# Patient Record
Sex: Female | Born: 1950 | Race: White | Hispanic: No | Marital: Married | Smoking: Never smoker
Health system: Southern US, Community
[De-identification: ages and names within clinical notes are randomized; demographics above are authoritative.]

## PROBLEM LIST (undated history)

## (undated) DIAGNOSIS — C801 Malignant (primary) neoplasm, unspecified: Secondary | ICD-10-CM

---

## 2015-01-16 ENCOUNTER — Encounter (HOSPITAL_COMMUNITY): Payer: Self-pay | Admitting: Emergency Medicine

## 2015-01-16 ENCOUNTER — Emergency Department (HOSPITAL_COMMUNITY): Payer: No Typology Code available for payment source

## 2015-01-16 ENCOUNTER — Emergency Department (HOSPITAL_COMMUNITY)
Admission: EM | Admit: 2015-01-16 | Discharge: 2015-01-16 | Disposition: A | Discharge status: Home / Self Care | Payer: No Typology Code available for payment source | Attending: Emergency Medicine | Admitting: Emergency Medicine

## 2015-01-16 DIAGNOSIS — Z23 Encounter for immunization: Secondary | ICD-10-CM | POA: Insufficient documentation

## 2015-01-16 DIAGNOSIS — S8011XA Contusion of right lower leg, initial encounter: Secondary | ICD-10-CM | POA: Diagnosis not present

## 2015-01-16 DIAGNOSIS — Y9389 Activity, other specified: Secondary | ICD-10-CM | POA: Insufficient documentation

## 2015-01-16 DIAGNOSIS — Z859 Personal history of malignant neoplasm, unspecified: Secondary | ICD-10-CM | POA: Insufficient documentation

## 2015-01-16 DIAGNOSIS — Y92410 Unspecified street and highway as the place of occurrence of the external cause: Secondary | ICD-10-CM | POA: Diagnosis not present

## 2015-01-16 DIAGNOSIS — Y998 Other external cause status: Secondary | ICD-10-CM | POA: Diagnosis not present

## 2015-01-16 DIAGNOSIS — S62291A Other fracture of first metacarpal bone, right hand, initial encounter for closed fracture: Secondary | ICD-10-CM | POA: Insufficient documentation

## 2015-01-16 DIAGNOSIS — S79922A Unspecified injury of left thigh, initial encounter: Secondary | ICD-10-CM | POA: Insufficient documentation

## 2015-01-16 DIAGNOSIS — S62201B Unspecified fracture of first metacarpal bone, right hand, initial encounter for open fracture: Secondary | ICD-10-CM

## 2015-01-16 DIAGNOSIS — S4991XA Unspecified injury of right shoulder and upper arm, initial encounter: Secondary | ICD-10-CM | POA: Diagnosis present

## 2015-01-16 HISTORY — DX: Malignant (primary) neoplasm, unspecified: C80.1

## 2015-01-16 LAB — URINALYSIS, ROUTINE W REFLEX MICROSCOPIC
Bilirubin Urine: NEGATIVE
Glucose, UA: NEGATIVE mg/dL
Hgb urine dipstick: NEGATIVE
Ketones, ur: NEGATIVE mg/dL
LEUKOCYTES UA: NEGATIVE
NITRITE: NEGATIVE
Protein, ur: NEGATIVE mg/dL
SPECIFIC GRAVITY, URINE: 1.014 (ref 1.005–1.030)
UROBILINOGEN UA: 0.2 mg/dL (ref 0.0–1.0)
pH: 7.5 (ref 5.0–8.0)

## 2015-01-16 LAB — BASIC METABOLIC PANEL
Anion gap: 8 (ref 5–15)
BUN: 18 mg/dL (ref 6–20)
CO2: 29 mmol/L (ref 22–32)
CREATININE: 0.93 mg/dL (ref 0.44–1.00)
Calcium: 10 mg/dL (ref 8.9–10.3)
Chloride: 104 mmol/L (ref 101–111)
GFR calc non Af Amer: >60 mL/min (ref >60)
Glucose, Bld: 103 mg/dL — ABNORMAL HIGH (ref 65–99)
Potassium: 4.1 mmol/L (ref 3.5–5.1)
Sodium: 141 mmol/L (ref 135–145)

## 2015-01-16 LAB — CBC WITH DIFFERENTIAL/PLATELET
BASOS PCT: 0 %
Basophils Absolute: 0 10*3/uL (ref 0.0–0.1)
EOS PCT: 1 %
Eosinophils Absolute: 0.1 10*3/uL (ref 0.0–0.7)
HEMATOCRIT: 39.2 % (ref 36.0–46.0)
Hemoglobin: 12.9 g/dL (ref 12.0–15.0)
Lymphocytes Relative: 22 %
Lymphs Abs: 2 10*3/uL (ref 0.7–4.0)
MCH: 27.9 pg (ref 26.0–34.0)
MCHC: 32.9 g/dL (ref 30.0–36.0)
MCV: 84.7 fL (ref 78.0–100.0)
MONO ABS: 0.5 10*3/uL (ref 0.1–1.0)
MONOS PCT: 5 %
NEUTROS ABS: 6.8 10*3/uL (ref 1.7–7.7)
Neutrophils Relative %: 72 %
PLATELETS: 225 10*3/uL (ref 150–400)
RBC: 4.63 MIL/uL (ref 3.87–5.11)
RDW: 13.3 % (ref 11.5–15.5)
WBC: 9.4 10*3/uL (ref 4.0–10.5)

## 2015-01-16 LAB — URINE MICROSCOPIC-ADD ON

## 2015-01-16 MED ORDER — TETANUS-DIPHTH-ACELL PERTUSSIS 5-2.5-18.5 LF-MCG/0.5 IM SUSP
0.5000 mL | Freq: Once | INTRAMUSCULAR | Status: AC
  Administered 2015-01-16: 0.5 mL via INTRAMUSCULAR
  Filled 2015-01-16: qty 0.5

## 2015-01-16 MED ORDER — IOHEXOL 300 MG/ML  SOLN
100.0000 mL | Freq: Once | INTRAMUSCULAR | Status: AC | PRN
  Administered 2015-01-16: 100 mL via INTRAVENOUS

## 2015-01-16 MED ORDER — OXYCODONE-ACETAMINOPHEN 5-325 MG PO TABS
2.0000 | ORAL_TABLET | Freq: Once | ORAL | Status: AC
  Administered 2015-01-16: 2 via ORAL
  Filled 2015-01-16: qty 2

## 2015-01-16 MED ORDER — OXYCODONE-ACETAMINOPHEN 5-325 MG PO TABS: 1.0000 | ORAL_TABLET | ORAL | Status: AC | PRN

## 2015-01-16 MED ORDER — OXYCODONE-ACETAMINOPHEN 5-325 MG PO TABS
1.0000 | ORAL_TABLET | Freq: Once | ORAL | Status: AC
  Administered 2015-01-16: 1 via ORAL
  Filled 2015-01-16: qty 1

## 2015-01-16 MED ORDER — SODIUM CHLORIDE 0.9 % IV SOLN
0.5000 mg/h | INTRAVENOUS | Status: DC
  Filled 2015-01-16: qty 5

## 2015-01-16 MED ORDER — HYDROMORPHONE HCL 1 MG/ML IJ SOLN
0.5000 mg | Freq: Once | INTRAMUSCULAR | Status: AC
  Administered 2015-01-16: 0.5 mg via INTRAVENOUS
  Filled 2015-01-16: qty 1

## 2015-01-16 NOTE — Progress Notes (Signed)
Orthopedic Tech Progress Note Patient Details:  Denise Harvey 1950-12-21 [id number]  Ortho Devices Type of Ortho Device: Ace wrap, Thumb spica splint Splint Material: Fiberglass Ortho Device/Splint Location: RUE Ortho Device/Splint Interventions: Ordered, Application   Braulio Bosch 01/16/2015, 10:29 PM

## 2015-01-16 NOTE — ED Notes (Signed)
Pt verbalized understanding of d/c instructions and has no further questions. Pt stable and NAD.  

## 2015-01-16 NOTE — ED Notes (Signed)
Pt returned from X-ray.  

## 2015-01-16 NOTE — ED Notes (Signed)
Ortho Tech in room to apply wrist splint
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
## 2015-01-16 NOTE — ED Notes (Signed)
Ortho tech paged  
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
